# Patient Record
Sex: Male | Born: 1965 | Race: White | Hispanic: No | Marital: Married | Smoking: Never smoker
Health system: Southern US, Community
[De-identification: ages and names within clinical notes are randomized; demographics above are authoritative.]

## PROBLEM LIST (undated history)

## (undated) DIAGNOSIS — E079 Disorder of thyroid, unspecified: Secondary | ICD-10-CM

## (undated) DIAGNOSIS — N3010 Interstitial cystitis (chronic) without hematuria: Secondary | ICD-10-CM

## (undated) DIAGNOSIS — E7800 Pure hypercholesterolemia, unspecified: Secondary | ICD-10-CM

## (undated) DIAGNOSIS — F32A Depression, unspecified: Secondary | ICD-10-CM

## (undated) DIAGNOSIS — F329 Major depressive disorder, single episode, unspecified: Secondary | ICD-10-CM

## (undated) DIAGNOSIS — I10 Essential (primary) hypertension: Secondary | ICD-10-CM

## (undated) HISTORY — PX: HERNIA REPAIR: SHX51

## (undated) HISTORY — PX: ANKLE SURGERY: SHX546

## (undated) HISTORY — PX: VASECTOMY: SHX75

---

## 2000-03-16 ENCOUNTER — Emergency Department (HOSPITAL_COMMUNITY): Admission: EM | Admit: 2000-03-16 | Discharge: 2000-03-16 | Payer: Self-pay | Admitting: Emergency Medicine

## 2000-03-16 ENCOUNTER — Encounter: Payer: Self-pay | Admitting: Emergency Medicine

## 2000-06-30 ENCOUNTER — Encounter: Admission: RE | Admit: 2000-06-30 | Discharge: 2000-09-28 | Payer: Self-pay | Admitting: Anesthesiology

## 2000-11-22 ENCOUNTER — Encounter: Payer: Self-pay | Admitting: Emergency Medicine

## 2000-11-22 ENCOUNTER — Emergency Department (HOSPITAL_COMMUNITY): Admission: EM | Admit: 2000-11-22 | Discharge: 2000-11-22 | Payer: Self-pay | Admitting: Emergency Medicine

## 2001-01-22 ENCOUNTER — Emergency Department (HOSPITAL_COMMUNITY): Admission: EM | Admit: 2001-01-22 | Discharge: 2001-01-22 | Payer: Self-pay | Admitting: *Deleted

## 2003-03-10 ENCOUNTER — Encounter (INDEPENDENT_AMBULATORY_CARE_PROVIDER_SITE_OTHER): Payer: Self-pay

## 2003-03-10 ENCOUNTER — Ambulatory Visit (HOSPITAL_BASED_OUTPATIENT_CLINIC_OR_DEPARTMENT_OTHER): Admission: RE | Admit: 2003-03-10 | Discharge: 2003-03-10 | Payer: Self-pay | Admitting: Urology

## 2003-03-10 ENCOUNTER — Ambulatory Visit (HOSPITAL_COMMUNITY): Admission: RE | Admit: 2003-03-10 | Discharge: 2003-03-10 | Payer: Self-pay | Admitting: Urology

## 2003-09-17 ENCOUNTER — Emergency Department (HOSPITAL_COMMUNITY): Admission: EM | Admit: 2003-09-17 | Discharge: 2003-09-18 | Payer: Self-pay | Admitting: Emergency Medicine

## 2003-12-06 ENCOUNTER — Encounter: Admission: RE | Admit: 2003-12-06 | Discharge: 2003-12-06 | Payer: Self-pay | Admitting: Internal Medicine

## 2003-12-08 ENCOUNTER — Emergency Department (HOSPITAL_COMMUNITY): Admission: EM | Admit: 2003-12-08 | Discharge: 2003-12-08 | Payer: Self-pay | Admitting: Emergency Medicine

## 2004-11-06 ENCOUNTER — Ambulatory Visit (HOSPITAL_COMMUNITY): Admission: RE | Admit: 2004-11-06 | Discharge: 2004-11-06 | Payer: Self-pay | Admitting: Urology

## 2004-11-28 ENCOUNTER — Emergency Department (HOSPITAL_COMMUNITY): Admission: EM | Admit: 2004-11-28 | Discharge: 2004-11-29 | Payer: Self-pay | Admitting: Emergency Medicine

## 2004-12-21 ENCOUNTER — Ambulatory Visit (HOSPITAL_COMMUNITY): Admission: RE | Admit: 2004-12-21 | Discharge: 2004-12-21 | Payer: Self-pay | Admitting: Urology

## 2004-12-21 ENCOUNTER — Ambulatory Visit (HOSPITAL_BASED_OUTPATIENT_CLINIC_OR_DEPARTMENT_OTHER): Admission: RE | Admit: 2004-12-21 | Discharge: 2004-12-21 | Payer: Self-pay | Admitting: Urology

## 2004-12-21 ENCOUNTER — Encounter (INDEPENDENT_AMBULATORY_CARE_PROVIDER_SITE_OTHER): Payer: Self-pay | Admitting: Specialist

## 2005-02-05 ENCOUNTER — Ambulatory Visit: Payer: Self-pay | Admitting: Internal Medicine

## 2005-02-13 ENCOUNTER — Ambulatory Visit: Payer: Self-pay | Admitting: Internal Medicine

## 2005-02-13 ENCOUNTER — Encounter: Payer: Self-pay | Admitting: Internal Medicine

## 2005-02-13 ENCOUNTER — Ambulatory Visit (HOSPITAL_COMMUNITY): Admission: RE | Admit: 2005-02-13 | Discharge: 2005-02-13 | Payer: Self-pay | Admitting: Internal Medicine

## 2005-04-16 ENCOUNTER — Encounter (HOSPITAL_COMMUNITY): Admission: RE | Admit: 2005-04-16 | Discharge: 2005-05-16 | Payer: Self-pay | Admitting: Orthopedic Surgery

## 2005-05-21 ENCOUNTER — Encounter (HOSPITAL_COMMUNITY): Admission: RE | Admit: 2005-05-21 | Discharge: 2005-06-20 | Payer: Self-pay | Admitting: Orthopedic Surgery

## 2005-09-12 ENCOUNTER — Ambulatory Visit (HOSPITAL_BASED_OUTPATIENT_CLINIC_OR_DEPARTMENT_OTHER): Admission: RE | Admit: 2005-09-12 | Discharge: 2005-09-12 | Payer: Self-pay | Admitting: Orthopedic Surgery

## 2006-06-20 ENCOUNTER — Ambulatory Visit (HOSPITAL_COMMUNITY): Admission: RE | Admit: 2006-06-20 | Discharge: 2006-06-20 | Payer: Self-pay | Admitting: Family Medicine

## 2007-02-23 IMAGING — CT CT ABDOMEN W/ CM
1 of 3 series · 14 of 32 positions shown, 19 images · IV contrast (omnipaque)
Comparison: none

CLINICAL DATA: Abdominopelvic pain. 
ABDOMEN CT WITH CONTRAST:
TECHNIQUE: Multidetector CT imaging of the abdomen was performed following the standard protocol during bolus administration of intravenous contrast.
Contrast:  100 cc Omnipaque 300.
TECHNIQUE: Multidetector CT imaging of the pelvis was performed following the standard protocol during bolus administration of intravenous contrast.

[Series 2: abd_pel 5.0 b40f st · axial · 0.70mm/px · z∈[-539,-99]mm · 14 of 98 slices shown, 19 images]
[im 5/98  soft-tissue]
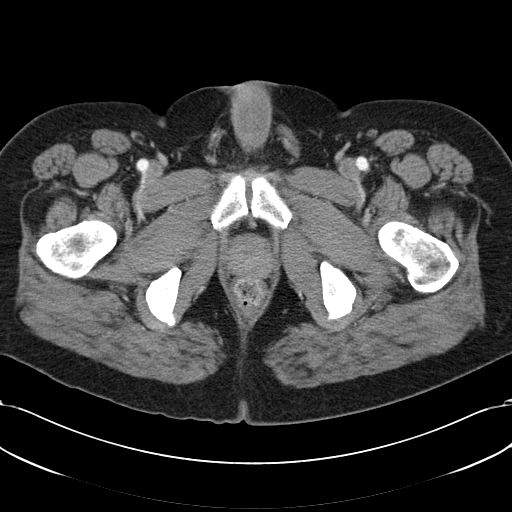
[im 5/98  bone]
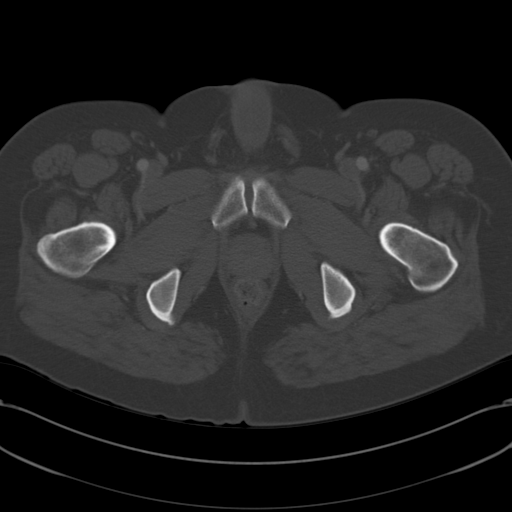
[im 14/98  soft-tissue]
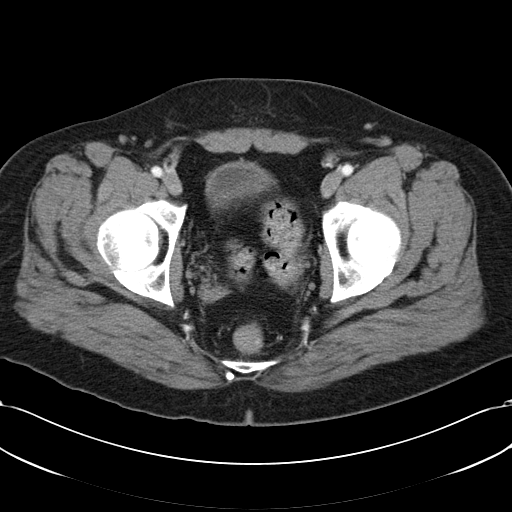
[im 19/98  soft-tissue]
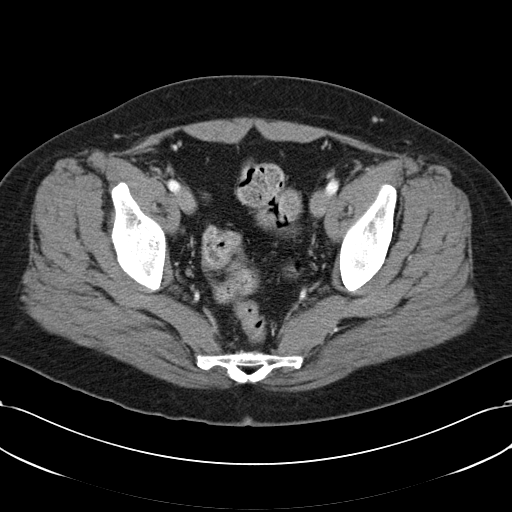
[im 28/98  soft-tissue]
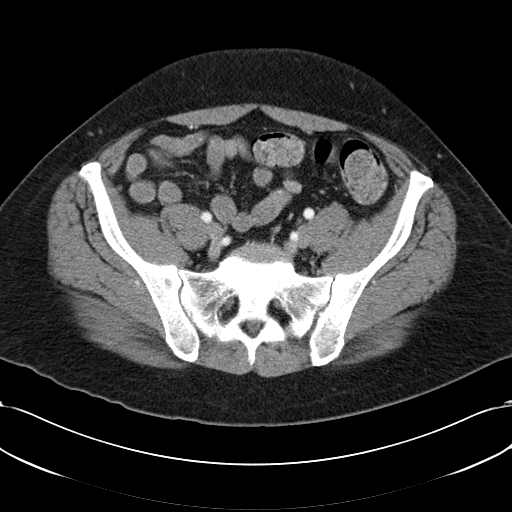
[im 33/98  soft-tissue]
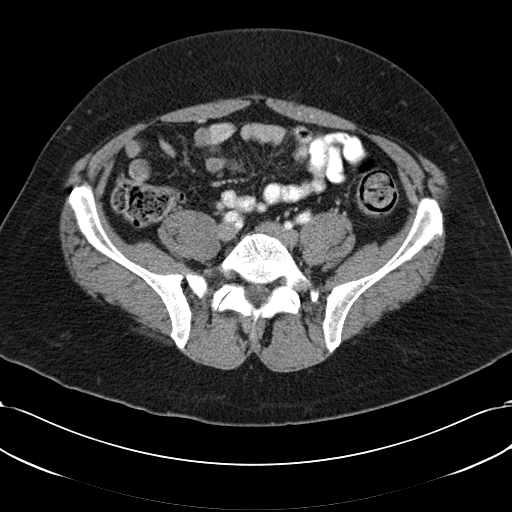
[im 42/98  soft-tissue]
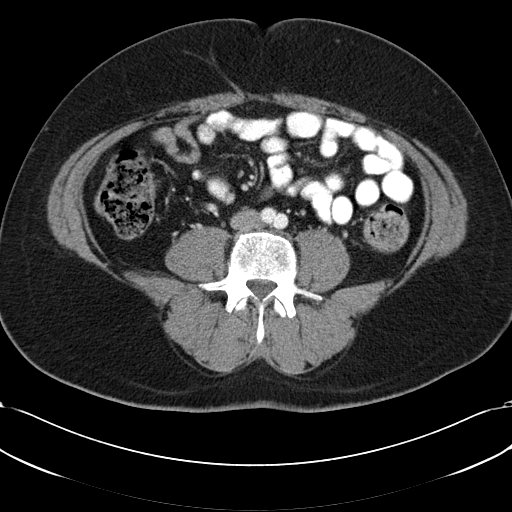
[im 51/98  soft-tissue]
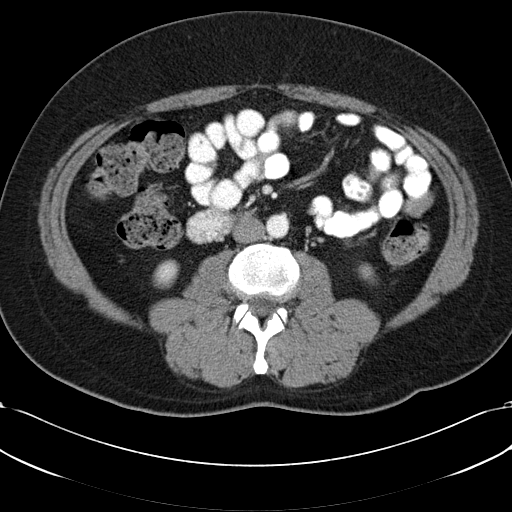
[im 56/98  soft-tissue]
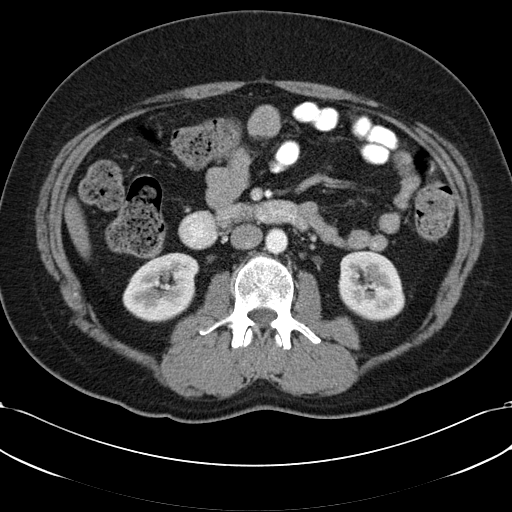
[im 65/98  soft-tissue]
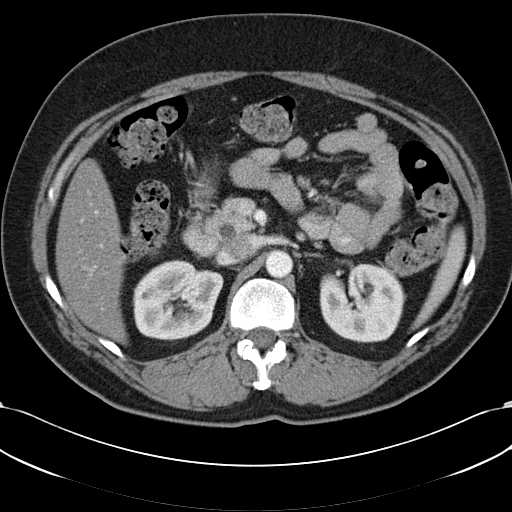
[im 65/98  bone]
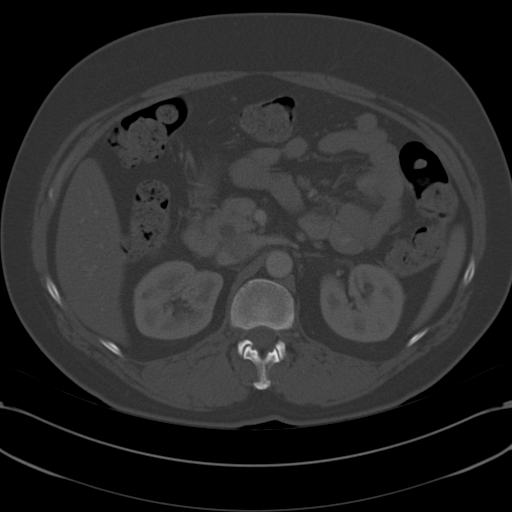
[im 70/98  soft-tissue]
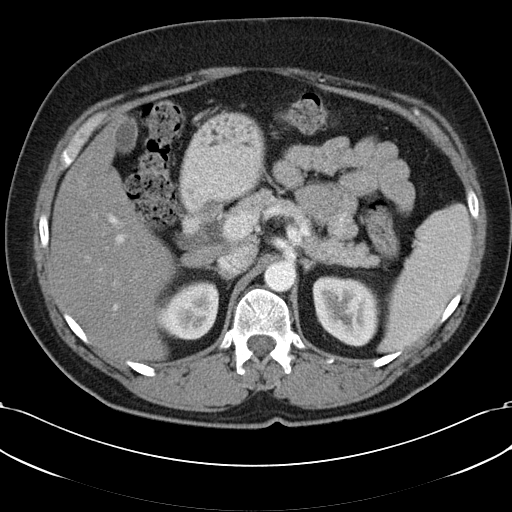
[im 79/98  soft-tissue]
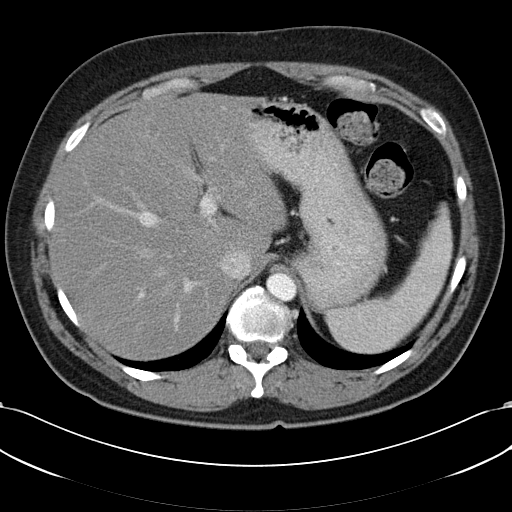
[im 79/98  lung]
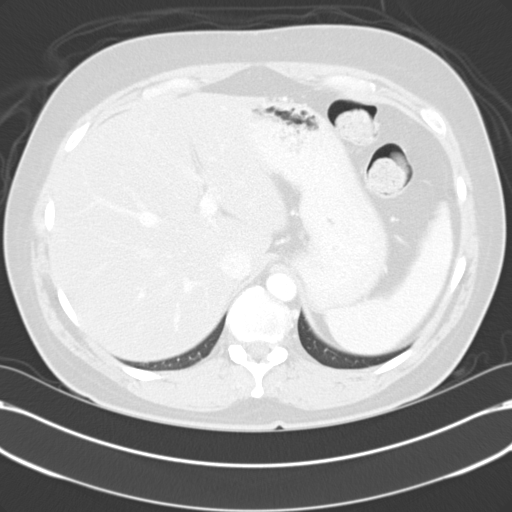
[im 84/98  soft-tissue]
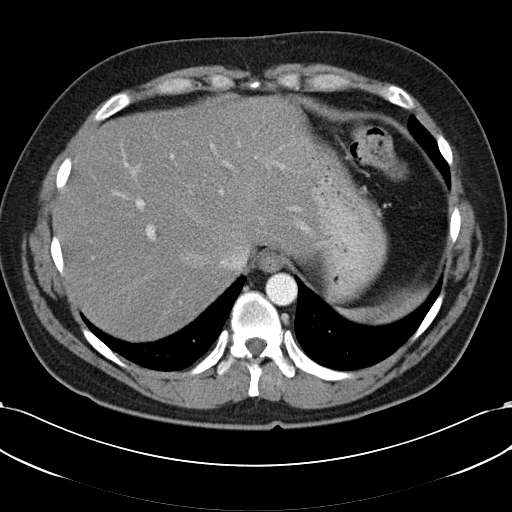
[im 84/98  lung]
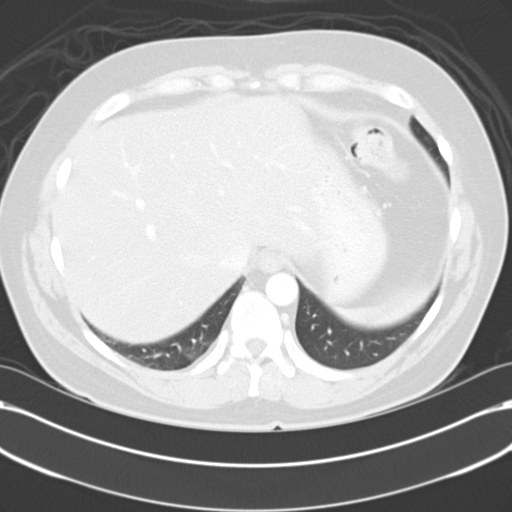
[im 88/98  lung]
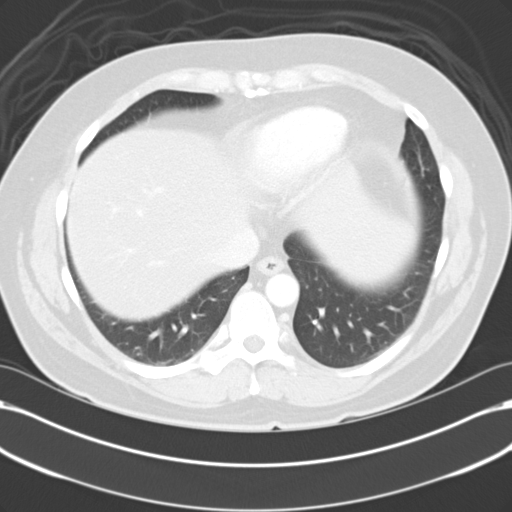
[im 93/98  soft-tissue]
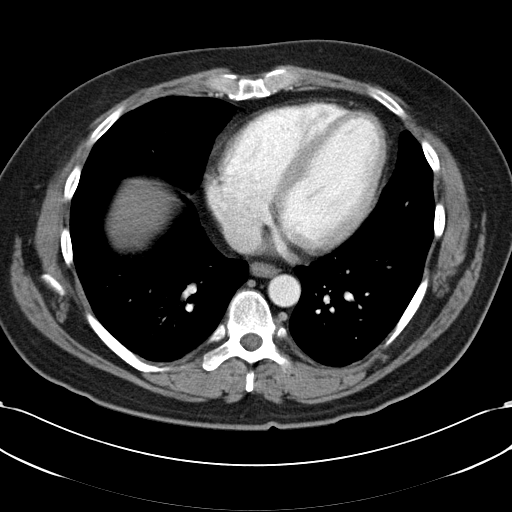
[im 93/98  lung]
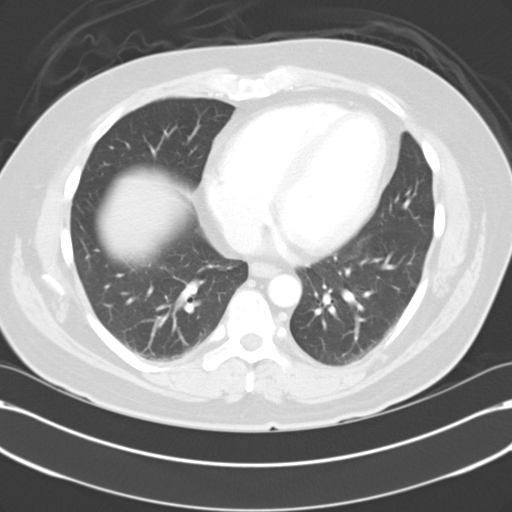

[14 of 32 positions shown; findings below may reference images not displayed]

FINDINGS: Diffuse fatty infiltration of the liver is noted.  There are several indeterminate low-density lesions in the liver that probably represent cysts.  Spleen, kidneys, adrenal glands, pancreas, and gallbladder are unremarkable.  No evidence of enlarged lymph nodes, free fluid, abdominal aortic aneurysm, or biliary dilatation.  The visualized bowel is within normal limits.
IMPRESSION: 1.  No acute abnormality. 
2.  Fatty infiltration of the liver. 
PELVIS CT WITH CONTRAST:
FINDINGS: The bladder and bowel are within normal limits.  No evidence of enlarged lymph nodes or free fluid.  No mass is identified.
IMPRESSION: No acute abnormality.

## 2007-03-02 ENCOUNTER — Encounter: Admission: RE | Admit: 2007-03-02 | Discharge: 2007-03-02 | Payer: Self-pay | Admitting: Orthopedic Surgery

## 2007-07-08 ENCOUNTER — Ambulatory Visit (HOSPITAL_COMMUNITY): Admission: RE | Admit: 2007-07-08 | Discharge: 2007-07-08 | Payer: Self-pay | Admitting: Orthopedic Surgery

## 2008-03-20 ENCOUNTER — Emergency Department (HOSPITAL_COMMUNITY): Admission: EM | Admit: 2008-03-20 | Discharge: 2008-03-20 | Payer: Self-pay | Admitting: Emergency Medicine

## 2009-08-25 ENCOUNTER — Ambulatory Visit (HOSPITAL_BASED_OUTPATIENT_CLINIC_OR_DEPARTMENT_OTHER): Admission: RE | Admit: 2009-08-25 | Discharge: 2009-08-25 | Payer: Self-pay | Admitting: Urology

## 2009-09-24 ENCOUNTER — Emergency Department (HOSPITAL_COMMUNITY): Admission: EM | Admit: 2009-09-24 | Discharge: 2009-09-24 | Payer: Self-pay | Admitting: Emergency Medicine

## 2010-06-25 LAB — POCT HEMOGLOBIN-HEMACUE: Hemoglobin: 13.8 g/dL (ref 13.0–17.0)

## 2010-08-21 NOTE — Op Note (Signed)
NAME:  Elijah Alvarez, Elijah Alvarez NO.:  [id number]   MEDICAL RECORD NO.:  [id number]          PATIENT TYPE:  AMB   LOCATION:  SDS                          FACILITY:  [facility]   PHYSICIAN:  Nadara Mustard, MD     DATE OF BIRTH:  [birth year]   DATE OF PROCEDURE:  07/08/2007  DATE OF DISCHARGE:                               OPERATIVE REPORT   PREOPERATIVE DIAGNOSIS:  Osteoarthritis, right foot talonavicular and  calcaneocuboid joints.   POSTOPERATIVE DIAGNOSIS:  Osteoarthritis, right foot talonavicular and  calcaneocuboid joints.   PROCEDURE:  Talonavicular and calcaneocuboid fusion, right foot.   SURGEON.:  Nadara Mustard, MD.   ANESTHESIA:  General plus popliteal block.   ESTIMATED BLOOD LOSS:  Minimal.   ANTIBIOTICS:  1 gram of Kefzol.   DRAINS:  None.   COMPLICATIONS:  None.   TOURNIQUET TIME:  Esmarch at the ankle for approximately 49 minutes.   DISPOSITION:  To the PACU in stable condition.   INDICATIONS FOR PROCEDURE:  The patient is a 45 year old gentleman who  is status post an ankle and subtalar fusion on the right.  The patient  has developed progressive degenerative arthritis of the talonavicular  and calcaneocuboid joints and presents at this time for fusion.  The  risks and benefits were discussed including infection, neurovascular  injury, persistent pain, need for additional surgery.  The patient  states he understands and wished to proceed at this time.   DESCRIPTION OF PROCEDURE:  The patient was brought to OR room 1 after  undergoing a popliteal block.  He then underwent a general anesthetic.  After an adequate level of anesthesia was obtained, the patient's right  lower extremity was prepped using DuraPrep and draped into a sterile  field.  The previous oblique incision over the sinus tarsi was extended  distally.  This was carried down to the calcaneocuboid joint.  The  cartilage was removed from the joint.  The wound was irrigated with  normal  saline.  There was good bleeding bone on both sides of the joint.  Attention was then focused on the talonavicular joint.  A dorsal  incision was made over the anterior tibial tendon.  Dissection was  carried down medial to the anterior tibial tendon and the talonavicular  joint was identified.  The articular cartilage was removed from the  joint.  The foot was then stabilized.  The patient was in slight  supination previously.  He was stabilized in a slight amount of valgus  to decrease stress over the lateral column.  Once this was stabilized  and held in place, the ankle dorsiflexed, and then two 3.5 cortical  screws were placed through the calcaneocuboid joint.  C-arm fluoroscopy  verified reduction in both the AP and lateral planes.  Two additional  screws were placed in the talonavicular joint and C-arm fluoroscopy also  verified reduction in both the AP and lateral planes.  The Esmarch was  released after 49 minutes.  Hemostasis was obtained.  The subcu was  closed using 2-0 Vicryl.  The skin was closed using 2-0 nylon.  The  wounds were covered Adaptic, orthopedic sponges, sterile Webril, Kerlix  and a Coban dressing.  The patient was then extubated and taken to the  PACU in stable condition.  Plan for discharge to home.  Prescription for  Tylox for pain.  Follow up in office in 2 weeks.      Nadara Mustard, MD  Electronically Signed     MVD/MEDQ  D:  07/08/2007  T:  07/08/2007  Job:  [id number]

## 2010-08-24 NOTE — Op Note (Signed)
NAME:  Elijah Alvarez, Elijah Alvarez NO.:  [id number]   MEDICAL RECORD NO.:  [id number]           PATIENT TYPE:   LOCATION:                                 FACILITY:   PHYSICIAN:  Nadara Mustard, MD          DATE OF BIRTH:   DATE OF PROCEDURE:  09/12/2005  DATE OF DISCHARGE:                                 OPERATIVE REPORT   PREOP DIAGNOSIS:  Osteoarthritis right ankle.   POSTOP DIAGNOSIS:  Osteoarthritis right ankle.   PROCEDURE:  Right ankle arthroscopy with percutaneous fusion with two 7.3  cannulated screws.   SURGEON:  Nadara Mustard, MD   ANESTHESIA:  General plus popliteal block.   ESTIMATED BLOOD LOSS:  Minimal.   ANTIBIOTICS:  1 gram of Kefzol.   DRAINS:  None.   COMPLICATIONS:  None.   TOURNIQUET TIME:  None.   DISPOSITION:  To PACU in stable condition.   INDICATIONS FOR PROCEDURE:  The patient is a 45 year old gentleman who is  status post a Taylor fracture; who has undergone approximately 4 surgical  procedures on his right ankle, secondary to the previous traumatic fracture.  The patient has degenerative arthritic changes of the tibial talar joint.  He has failed conservative care; has pain with activities of daily living;  and wishes to proceed with fusion of the ankle.  Risks and benefits were  discussed including infection, neurovascular injury, persistent pain, need  for additional surgery.  The patient states he understands and wishes to  proceed at this time.   DESCRIPTION OF PROCEDURE:  The patient was brought to OR room #5 after  undergoing a popliteal block.  After adequate level of anesthesia obtained,  the patient's right lower extremity was prepped using DuraPrep, draped into  a sterile field; and his foot was placed in the ankle distraction traction.  Previous arthroscopic portals were used; and the scope was inserted through  the anterior medial portal, and the anterior lateral portal was established  for a working portal.   Visualization showed complete traumatic arthritis  involving the entire tibial talar joint.   The vapor Mitek was used for debridement of the synovitis; and the shaver  was also used for debridement of synovitis.  The acromionizer was then used  to debride the articular cartilage back to bleeding viable subchondral bone,  both on the to the tibia and talus.  This was a beveled flap to allow for  good cancellus bone contact.  The ankle was placed in 90 degrees of  dorsiflexion.  There was good subchondral bone contact across the joint.  The instruments were removed and 2 percutaneous 7.3 screws were placed  proximal medial to distal lateral from the tibial to the talus.  These were  measured 55 and 75 in length.  These were over drilled and the counter sink  tap was used.  The screws were placed under C-arm fluoroscopy to verify  placement.  Radiographs showed an ankle with 90-degrees of dorsiflexion with  good contact across the fusion site.   The instruments were removed.  Guidewires were removed.  The portals were  closed using 3-0 nylon.  The wounds were covered with Adaptic orthopedic  sponges, ABD dressing, Webril and a Coban dressing.  The patient was  extubated, taken to PACU in stable condition.  Plan for discharge to home.  Prescription for Tylox.  Instructions for ice, elevation, nonweightbearing  on the right follow-up in the office in 1 week.      Nadara Mustard, MD  Electronically Signed     MVD/MEDQ  D:  09/12/2005  T:  09/13/2005  Job:  [id number]

## 2010-08-24 NOTE — Op Note (Signed)
NAME:  Elijah Alvarez, Elijah Alvarez NO.:  [id number]   MEDICAL RECORD NO.:  [id number]          PATIENT TYPE:  AMB   LOCATION:  [facility]                         FACILITY:  [facility]   PHYSICIAN:  Sigmund I. Patsi Sears, M.D.DATE OF BIRTH:  December 16, 1965   DATE OF PROCEDURE:  12/21/2004  DATE OF DISCHARGE:                                 OPERATIVE REPORT   PREOPERATIVE DIAGNOSIS:  Interstitial cystitis.   POSTOPERATIVE DIAGNOSIS.:  Interstitial cystitis.   OPERATIONS:  Cystourethroscopy, hydrodistention of bladder (650 cc), biopsy  of the bladder, cauterization of the biopsy sites, installation of Pyridium  and Marcaine solution in the  bladder.   SURGEON:  Sigmund I. Patsi Sears, M.D.   ANESTHESIA:  General LMA.   PREPARATION:  Appropriate preanesthesia, the patient is brought to the  operating room, placed on the operating room in dorsal supine position where  general LMA anesthesia was induced. He was then placed in dorsal lithotomy  position. B&O suppository was given. Cystoscopy was accomplished, and the  bladder was hydrodistended to 650 cc. Following this, cold cup bladder  biopsies were taken. Multiple areas of bladder hemorrhage were identified  consistent with interstitial cystitis. Following this, the bladder was  drained of fluid, and Marcaine and Pyridium solution was inserted in the  bladder. The patient received IV Toradol, awakened and taken to the recovery  room in good condition.      Sigmund I. Patsi Sears, M.D.  Electronically Signed     SIT/MEDQ  D:  12/21/2004  T:  12/22/2004  Job:  [id number]

## 2010-08-24 NOTE — Op Note (Signed)
NAME:  TORREZ, RENFROE                ACCOUNT NO.:  [id number]   MEDICAL RECORD NO.:  [id number]          PATIENT TYPE:  AMB   LOCATION:  DAY                           FACILITY:  [facility]   PHYSICIAN:  R. Roetta Sessions, M.D. DATE OF BIRTH:  April 05, 1966   DATE OF PROCEDURE:  02/13/2005  DATE OF DISCHARGE:                                 OPERATIVE REPORT   PROCEDURE:  Colonoscopy, ileoscopy, biopsy.   INDICATIONS FOR PROCEDURE:  The patient is a 45 year old Caucasian male with  chronic intermittent constipation on methadone therapy with intermittent  paper hematochezia. Colonoscopy is now being done. This approach has been  discussed with the patient at length. Potential risks, benefits, and  alternatives have been reviewed and questions answered. He is agreeable.  Please see documentation in the medical record.   PROCEDURE NOTE:  O2 saturation, blood pressure, pulse, and respirations were  monitored throughout the entire procedure. Conscious sedation with Versed 5  mg IV and Demerol 125 mg IV in divided doses.   INSTRUMENT:  Olympus video chip system.   FINDINGS:  Digital rectal exam revealed no abnormalities.   ENDOSCOPIC FINDINGS:  Prep was adequate.   Rectum:  Examination of the rectal mucosa including retroflexed view of the  anal verge and ___________ view of the anal canal revealed only anal canal  internal hemorrhoids. The rectal mucosa otherwise appeared entirely normal.   Colon:  Colonic mucosa was surveyed from the rectosigmoid junction through  the left, transverse, and right colon to the area of the appendiceal  orifice, ileocecal valve, and cecum. These structures were well seen and  photographed for the record. Terminal ileum was intubated to 10 cm. From  this level, the scope was slowly withdrawn, and all previously mentioned  mucosal surfaces were again seen. The terminal ileal mucosa and the entire  colonic mucosa appeared normal except for a 3-mm diminutive polyp at  30 cm  which was cold biopsied/removed. The patient tolerated the procedure well  and was reactive to endoscopy.   IMPRESSION:  1.  Anal canal and internal hemorrhoids. Otherwise normal rectum.  2.  Diminutive polyp at 30 cm, cold biopsied/removed. The remainder of the      colonic mucosa and terminal ileum appeared normal.   I suspect the patient is bleeding from his anorectum secondary to  hemorrhoids. An occult fissure is not excluded at this time.   RECOMMENDATIONS:  1.  Hemorrhoid literature provided to Mr. Fortenberry. Continue MiraLax. Would use      liberally on a nightly basis to produce relatively good bowel daily to      every other day. Daily Metamucil or Citrucel fiber supplement. Ten-      day course of Anusol HC suppositories 1 per rectum at bedtime.  2.  Follow up on pathology.  3.  Further recommendations to follow.      Jonathon Bellows, M.D.  Electronically Signed     RMR/MEDQ  D:  02/13/2005  T:  02/13/2005  Job:  [id number]   cc:   Corrie Mckusick, M.D.  Fax: [phone]

## 2010-08-24 NOTE — Consult Note (Signed)
NAME:  Elijah Alvarez, Elijah Alvarez                ACCOUNT NO.:  [id number]   MEDICAL RECORD NO.:  [id number]          PATIENT TYPE:  AMB   LOCATION:                                FACILITY:  [facility]   PHYSICIAN:  R. Roetta Sessions, M.D. DATE OF BIRTH:  01/17/1966   DATE OF CONSULTATION:  02/05/2005  DATE OF DISCHARGE:                                   CONSULTATION   CHIEF COMPLAINT:  Abdominal pain, nausea, constipation, and intermittent  hematochezia.   HISTORY OF PRESENT ILLNESS:  Mr. Elijah Alvarez is a 45 year old Caucasian male who  is referred from Dr. Phillips Odor. He notes about three or four months ago he was  diagnosed with prostatitis. He was started on Cipro. He was also treated  with doxycycline. He began to have bilateral low abdominal cramping pain  which radiated to his mid abdomen. He describes the pain as a soreness.  Pain is rated 8/10 on the pain scale. He has been to the ER for this pain.  Generally, the patient is much better today. Since he did continue the  antibiotics and was started on Elmiron, he feels much better. He did have  some nausea and vomiting, but this has mostly subsided as well. He still has  occasional waves of transient nausea. He generally has a bowel movement  every three to four days which is formed. He does note toilet tissue  hematochezia in a small to moderate amount on a few occasions recently. He  also complains of proctalgia and rectal soreness. He denies any melena.   PAST MEDICAL HISTORY:  1.  Sarcoidosis.  2.  Depression.  3.  Prostatitis.  4.  Interstitial cystitis.  5.  Chronic back pain for which he is followed by Dr. Omelia Blackwater.  6.  GERD.  7.  Hernia/hydrocele repair.  8.  Ankle fracture, right ankle surgery due to fracture.  9.  Right carpal tunnel repair.   CURRENT MEDICATIONS:  1.  Elmiron 200 mg b.i.d.  2.  Ritalin 40 mg daily.  3.  Methadone 10 mg t.i.d.  4.  Serzone 450 mg daily.  5.  Neurontin 300 mg b.i.d.  6.  Prevacid 30 mg q.a.m.  7.   Fleet's stool softener p.r.n.   ALLERGIES:  No known drug allergies.   FAMILY HISTORY:  No known family history of colorectal carcinoma of the  liver or chronic GI problems other than a mother with IBS. His father tells  me he has one healthy sister.   SOCIAL HISTORY:  Mr. Elijah Alvarez has been married for 13 years. He has healthy 25-  year-old. He is self-employed and does home repairs. 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 Currently,  he denies either.   REVIEW OF SYSTEMS:  CONSTITUTIONAL:  Denies any anorexia. Denies any early  satiety. His weight is stable. Denies any fever or chills. CARDIOVASCULAR:  Denies any chest pain or palpitations. PULMONARY:  Denies any shortness of  breath, dyspnea, cough, or hemoptysis. GASTROINTESTINAL:  He has history of  heartburn and indigestion as well  as water brash. He has been treated with  Prevacid 30 mg daily for about the last 8 months which has resulted in good  relief of his symptoms. He denies any dysphagia or odynophagia.   PHYSICAL EXAMINATION:  VITAL SIGNS:  Weight 206 pounds, height 71 inches,  temperature 98.3, blood pressure 106/70, pulse 64.  GENERAL:  Mr. Elijah Alvarez is a 45 year old obese Caucasian male who is alert,  oriented, pleasant and cooperative in no acute distress.  HEENT:  Sclerae are clear and nonicteric. Conjunctivae is pink. Oropharynx  pink and moist without any lesions.  NECK:  Supple without any mass or thyromegaly.  CHEST:  Heart regular rate and rhythm. Normal S1 and S2. Lungs are clear to  auscultation bilaterally.  ABDOMEN:  Positive bowel sounds x4. No bruits auscultated. Soft,  nondistended, without palpable mass or hepatosplenomegaly. He does have some  mild tenderness to the left upper quadrant on deep palpation. No rebound  tenderness or guarding.  RECTAL:  No external lesions visualized. Good sphincter tone. No internal  masses palpated. Small amount of light brown  stool was obtained from the  vault which was hemoccult negative.  EXTREMITIES:  Without edema bilaterally.   LABORATORY DATA:  CT scan from November 29, 2004 shows fatty infiltration of  the liver and otherwise normal exam. Normal LFTs on December 05, 2004.   IMPRESSION:  Mr. Elijah Alvarez is a 45 year old Caucasian male who presents today  with about a four-month history of abdominal pain, and nausea which began  after treatment with antibiotics for his prostatitis. Now, his symptoms have  pretty resolved. He continues to have transient nausea without emesis. He  does have chronic constipation, and he generally has a bowel movement every  three to four days. He also notes toilet tissue hematochezia in small to  moderate amount. There was nothing on rectal exam to account for this.  Therefore, I feel we should proceed with colonoscopy at this time. He was  also noted to have fatty liver on ultrasound and has a set of normal LFTs  from Dr. Lamar Blinks office. Gastroesophageal reflux disease symptoms well  controlled on daily PPI.   PLAN:  1.  We will schedule colonoscopy with Dr. Jena Gauss in the near future. I have      discussed the procedure including risks and benefits which include but      are not limited to bleeding, infection, perforation, drug reaction. He      agrees with this plan, and consent will be obtained.  2.  Prescription given for Maalox 17 g daily ________________ two refills.   We would like to thank Dr. Phillips Odor for allowing [place] to participate in the  care of Mr. Elijah Alvarez.      Nicholas Lose, N.P.      Jonathon Bellows, M.D.  Electronically Signed    KC/MEDQ  D:  02/06/2005  T:  02/06/2005  Job:  [id number]   cc:   Corrie Mckusick, M.D.  Fax: [phone]

## 2010-08-24 NOTE — Op Note (Signed)
NAME:  Elijah Alvarez, Elijah Alvarez                          ACCOUNT NO.:  [id number]   MEDICAL RECORD NO.:  [id number]                   PATIENT TYPE:  AMB   LOCATION:  [facility]                                 [facility]ACILITY[facility]   PHYSICIAN:  Sigmund I. Patsi Sears, M.D.         DATE OF BIRTH:  [birth year]   DATE OF PROCEDURE:  03/10/2003  DATE OF DISCHARGE:                                 OPERATIVE REPORT   PREOPERATIVE DIAGNOSIS:  Elective sterilization.   POSTOPERATIVE DIAGNOSIS:  Elective sterilization.   OPERATION:  Vasectomy.   SURGEON:  Sigmund I. Patsi Sears, M.D.   ANESTHESIA:  LMA.   PREPARATION:  After appropriate preanesthesia, the patient was brought to  the operating room, placed on the operating table in a dorsal supine  position, where general LMA anesthesia was introduced.  He remained in this  position, where the pubis was prepped with Betadine solution and draped in  the usual fashion.  Because the patient shaved himself greater than 12 hours  before surgery, it was elected to give him IV Ancef and postoperative  doxycycline to avoid any skin infection.   REVIEW OF HISTORY:  This 45 year old married male, who with his wife desires  vasectomy, also has a history of scrotal surgery in the past with scarring  on the right hemiscrotum.  He is now for vasectomy.   DESCRIPTION OF PROCEDURE:  The right and left vas were isolated bilaterally,  and incisions were made bilaterally.  The vas was then insolated  bilaterally, injected with Marcaine solution 0.25% plain, and dissected.  A  portion is amputated, and ends are cauterized.  Each end is then oversewn  with 3-0 Vicryl suture.  Following this, the wounds are closed in two layers  with 3-0 Vicryl suture and collodion is placed over the wounds.  The patient  was then awakened and taken to the recovery room in good condition.                                               Sigmund I. Patsi Sears, M.D.    SIT/MEDQ  D:  03/10/2003  T:   03/10/2003  Job:  [id number]
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
## 2010-12-31 LAB — BASIC METABOLIC PANEL
BUN: 11
Calcium: 9.7
Chloride: 101
Creatinine, Ser: 0.9
Glucose, Bld: 105 — ABNORMAL HIGH
Potassium: 5.1
Sodium: 139

## 2010-12-31 LAB — CBC
HCT: 37.8 — ABNORMAL LOW
Hemoglobin: 12.9 — ABNORMAL LOW
MCHC: 34.2
Platelets: 190
RBC: 4.3
WBC: 3.8 — ABNORMAL LOW

## 2010-12-31 LAB — HEPATIC FUNCTION PANEL: Albumin: 3.9

## 2011-09-13 ENCOUNTER — Other Ambulatory Visit (HOSPITAL_COMMUNITY): Payer: Self-pay | Admitting: Internal Medicine

## 2011-09-13 DIAGNOSIS — R1313 Dysphagia, pharyngeal phase: Secondary | ICD-10-CM

## 2011-09-17 ENCOUNTER — Other Ambulatory Visit (HOSPITAL_COMMUNITY): Payer: Self-pay

## 2011-09-20 ENCOUNTER — Other Ambulatory Visit (HOSPITAL_COMMUNITY): Payer: Self-pay

## 2012-02-11 ENCOUNTER — Ambulatory Visit (INDEPENDENT_AMBULATORY_CARE_PROVIDER_SITE_OTHER): Payer: Federal, State, Local not specified - PPO | Admitting: Orthopedic Surgery

## 2012-02-11 ENCOUNTER — Other Ambulatory Visit: Payer: Self-pay | Admitting: Orthopedic Surgery

## 2012-02-11 ENCOUNTER — Encounter: Payer: Self-pay | Admitting: Orthopedic Surgery

## 2012-02-11 ENCOUNTER — Ambulatory Visit (HOSPITAL_COMMUNITY)
Admission: RE | Admit: 2012-02-11 | Discharge: 2012-02-11 | Disposition: A | Discharge status: Home / Self Care | Payer: Federal, State, Local not specified - PPO | Source: Ambulatory Visit | Attending: Orthopedic Surgery | Admitting: Orthopedic Surgery

## 2012-02-11 VITALS — Ht 71.0 in | Wt 228.0 lb

## 2012-02-11 DIAGNOSIS — M069 Rheumatoid arthritis, unspecified: Secondary | ICD-10-CM

## 2012-02-11 DIAGNOSIS — M7989 Other specified soft tissue disorders: Secondary | ICD-10-CM | POA: Insufficient documentation

## 2012-02-11 DIAGNOSIS — G5601 Carpal tunnel syndrome, right upper limb: Secondary | ICD-10-CM | POA: Insufficient documentation

## 2012-02-11 DIAGNOSIS — G5600 Carpal tunnel syndrome, unspecified upper limb: Secondary | ICD-10-CM

## 2012-02-11 NOTE — Patient Instructions (Addendum)
Go to lab across from hospital for lab work   NCS with Dr Gerilyn Pilgrim for NCS carpal tunnel syndrome   Go to [facility] for bilateral hand x- rays

## 2012-02-12 ENCOUNTER — Encounter: Payer: Self-pay | Admitting: Orthopedic Surgery

## 2012-02-12 NOTE — Progress Notes (Signed)
Patient ID: Elijah Alvarez, male   DOB: 24-Mar-1966, 46 y.o.   MRN: [id number] Chief Complaint  Patient presents with  . Hand Problem    Referring physician Dr. Phillips Odor, swelling of the hands and feet    This is a 46 year old male who complains of bilateral swelling of his hands and feet for the last 2 months. Symptoms came on gradually he status post a right carpal tunnel release. He says these symptoms feel different. He complains of dull burning 3/10 pain which is intermittent tends to come and go and will for 3 or 4 days and then go away with no provoking mechanism. Swelling noted numbness and tingling noted as stated.  He does say that the hands feel tight . Again he thinks that these symptoms are different from the carpal tunnel.  Other review of systems he has some depression otherwise symptoms in the review of systems are negative other than described  Medical history of depression, thyroid disease, high cholesterol, cystitis. He's had 11 surgeries on the right ankle. He has 2 hernia repairs. Right carpal tunnel release.   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 He completed 12 years of school he does not smoke drink or use street drugs.  His medications include a medication for cholesterol, thyroid, he takes methadone, Ritalin, Cymbalta, and ELMIRON Synthroid 100 mcg daily, Cymbalta 20 mg daily, Lipitor 10 mg daily, Viagra 100 mg as needed, methadone 5 mg every 4 hours  Ht 5\' 11"  (1.803 m)  Wt 228 lb (103.42 kg)  BMI 31.80 kg/m2 Further examination reveals that this is a well-developed well-nourished male he appears to have good mood and affect he walks normally he is oriented x3 his appearance is normal.  Both hands are swollen and tight. He has decreased range of motion. No instability. Decreased grip strength. Incision over the right carpal tunnel. Skin over the left wrist normal.  Distal pulses and temperature  normal. Lymph nodes normal in the epitrochlear region. He has normal sensation to soft touch. No pathologic reflexes with a negative Hoffman test. Balance normal.  Recommend further diagnostic studies to include I nerve conduction study. A sedimentation rate and a rheumatoid factor  Call patient with results after nerve conduction study. Not sure of diagnosis  Our working diagnosis will be recurrent carpal tunnel syndrome and rheumatoid arthritis

## 2012-02-13 ENCOUNTER — Telehealth: Payer: Self-pay | Admitting: Orthopedic Surgery

## 2012-02-13 NOTE — Telephone Encounter (Signed)
Elijah Alvarez called to ask if you have his lab results yet. His # is [phone]

## 2012-02-14 ENCOUNTER — Telehealth: Payer: Self-pay | Admitting: Radiology

## 2012-02-14 NOTE — Telephone Encounter (Signed)
I faxed a referral to Dr. Gerilyn Pilgrim for patient to have NCS.

## 2014-01-03 ENCOUNTER — Other Ambulatory Visit (HOSPITAL_COMMUNITY): Payer: Self-pay | Admitting: Family Medicine

## 2014-01-03 DIAGNOSIS — E049 Nontoxic goiter, unspecified: Secondary | ICD-10-CM

## 2014-01-03 DIAGNOSIS — E039 Hypothyroidism, unspecified: Secondary | ICD-10-CM

## 2014-01-04 ENCOUNTER — Ambulatory Visit (HOSPITAL_COMMUNITY)
Admission: RE | Admit: 2014-01-04 | Discharge: 2014-01-04 | Disposition: A | Discharge status: Home / Self Care | Payer: Federal, State, Local not specified - PPO | Source: Ambulatory Visit | Attending: Family Medicine | Admitting: Family Medicine

## 2014-01-04 DIAGNOSIS — E039 Hypothyroidism, unspecified: Secondary | ICD-10-CM | POA: Insufficient documentation

## 2014-01-04 DIAGNOSIS — E049 Nontoxic goiter, unspecified: Secondary | ICD-10-CM | POA: Insufficient documentation

## 2015-01-18 ENCOUNTER — Telehealth: Payer: Self-pay | Admitting: Internal Medicine

## 2015-01-18 NOTE — Telephone Encounter (Signed)
NOV RECALL FOR TCS °

## 2015-01-18 NOTE — Telephone Encounter (Signed)
Letter mailed to pt.  

## 2015-02-28 ENCOUNTER — Emergency Department (HOSPITAL_COMMUNITY)
Admission: EM | Admit: 2015-02-28 | Discharge: 2015-02-28 | Disposition: A | Discharge status: Home / Self Care | Payer: Federal, State, Local not specified - PPO | Attending: Emergency Medicine | Admitting: Emergency Medicine

## 2015-02-28 ENCOUNTER — Encounter (HOSPITAL_COMMUNITY): Payer: Self-pay

## 2015-02-28 DIAGNOSIS — Z79899 Other long term (current) drug therapy: Secondary | ICD-10-CM | POA: Insufficient documentation

## 2015-02-28 DIAGNOSIS — F329 Major depressive disorder, single episode, unspecified: Secondary | ICD-10-CM | POA: Insufficient documentation

## 2015-02-28 DIAGNOSIS — G4700 Insomnia, unspecified: Secondary | ICD-10-CM | POA: Insufficient documentation

## 2015-02-28 DIAGNOSIS — F1120 Opioid dependence, uncomplicated: Secondary | ICD-10-CM | POA: Diagnosis present

## 2015-02-28 DIAGNOSIS — F419 Anxiety disorder, unspecified: Secondary | ICD-10-CM | POA: Diagnosis not present

## 2015-02-28 DIAGNOSIS — R000 Tachycardia, unspecified: Secondary | ICD-10-CM | POA: Diagnosis not present

## 2015-02-28 DIAGNOSIS — F1123 Opioid dependence with withdrawal: Secondary | ICD-10-CM

## 2015-02-28 DIAGNOSIS — E7800 Pure hypercholesterolemia, unspecified: Secondary | ICD-10-CM | POA: Diagnosis not present

## 2015-02-28 DIAGNOSIS — E785 Hyperlipidemia, unspecified: Secondary | ICD-10-CM | POA: Diagnosis not present

## 2015-02-28 DIAGNOSIS — I10 Essential (primary) hypertension: Secondary | ICD-10-CM | POA: Diagnosis not present

## 2015-02-28 DIAGNOSIS — Z87448 Personal history of other diseases of urinary system: Secondary | ICD-10-CM | POA: Diagnosis not present

## 2015-02-28 DIAGNOSIS — F1193 Opioid use, unspecified with withdrawal: Secondary | ICD-10-CM

## 2015-02-28 HISTORY — DX: Major depressive disorder, single episode, unspecified: F32.9

## 2015-02-28 HISTORY — DX: Pure hypercholesterolemia, unspecified: E78.00

## 2015-02-28 HISTORY — DX: Disorder of thyroid, unspecified: E07.9

## 2015-02-28 HISTORY — DX: Depression, unspecified: F32.A

## 2015-02-28 HISTORY — DX: Interstitial cystitis (chronic) without hematuria: N30.10

## 2015-02-28 NOTE — ED Provider Notes (Signed)
CSN: [id number]     Arrival date & time 02/28/15  2103 History   First MD Initiated Contact with Patient 02/28/15 2159     Chief Complaint  Patient presents with  . Addiction Problem  . Hypertension     (Consider location/radiation/quality/duration/timing/severity/associated sxs/prior Treatment) HPI Comments: 49 year old male with past medical history including depression, hyperlipidemia, methadone dependence who presents with medication withdrawal. Patient reports a 15 year history of methadone use. He is going to be scheduled for ankle joint replacement in the future and wants to be off methadone before the surgery. He has attempted to wean himself from methadone, going from 60 mg daily to 30 mg 2-3 weeks ago, then to  ~1 week ago. He reports several days of difficulty sleeping despite taking melatonin and Benadryl, diaphoresis, hypertension, and anxiety. He states that he has decreased energy for his depression seems worse but he denies any suicidal ideation. His wife has been assisting him with his methadone administration. He has an appointment in December with a psychiatrist who prescribes his methadone but has not yet contacted them regarding weaning off of the medication. No fevers or recent illness.  Patient is a 49 y.o. male presenting with hypertension. The history is provided by the patient.  Hypertension    Past Medical History  Diagnosis Date  . Thyroid disease   . Hypercholesteremia   . Interstitial cystitis   . Depression    Past Surgical History  Procedure Laterality Date  . Ankle surgery      multiple right ankle surgeries  . Hernia repair    . Vasectomy     No family history on file. Social History  Substance Use Topics  . Smoking status: Never Smoker   . Smokeless tobacco: None  . Alcohol Use: No    Review of Systems 10 Systems reviewed and are negative for acute change except as noted in the HPI.    Allergies  Review of patient's allergies  indicates no known allergies.  Home Medications   Prior to Admission medications   Medication Sig Start Date End Date Taking? Authorizing Provider  atorvastatin (LIPITOR) 10 MG tablet Take 1 tablet by mouth every morning.  02/27/15  Yes Historical Provider, MD  Ciclopirox 1 % shampoo Apply 1 application topically See admin instructions. Every third day 12/01/14  Yes Historical Provider, MD  diphenhydrAMINE (BENADRYL) 25 mg capsule Take 150-200 mg by mouth at bedtime as needed for sleep.   Yes Historical Provider, MD  DULoxetine (CYMBALTA) 60 MG capsule Take 1 capsule by mouth daily. 02/27/15  Yes Historical Provider, MD  ELMIRON 100 MG capsule Take 1 capsule by mouth daily. 02/27/15  Yes Historical Provider, MD  levothyroxine (SYNTHROID, LEVOTHROID) 100 MCG tablet Take 1 tablet by mouth daily. 02/27/15  Yes Historical Provider, MD  levothyroxine (SYNTHROID, LEVOTHROID) 25 MCG tablet Take 1 tablet by mouth daily. 02/27/15  Yes Historical Provider, MD  methadone (DOLOPHINE) 10 MG tablet Take 30 mg by mouth 2 (two) times daily.   Yes Historical Provider, MD   BP 157/117 mmHg  Pulse 117  Temp(Src) 98.1 F (36.7 C) (Oral)  Resp 20  Wt 215 lb (97.523 kg)  SpO2 100% Physical Exam  Constitutional: He is oriented to person, place, and time. He appears well-developed and well-nourished. No distress.  HENT:  Head: Normocephalic and atraumatic.  Moist mucous membranes  Eyes: Pupils are equal, round, and reactive to light.  Scleral injection  Neck: Neck supple.  Cardiovascular: Normal rate, regular rhythm and  normal heart sounds.   No murmur heard. Pulmonary/Chest: Effort normal and breath sounds normal.  Abdominal: Soft. Bowel sounds are normal. He exhibits no distension. There is no tenderness.  Musculoskeletal: He exhibits no edema.  Neurological: He is alert and oriented to person, place, and time.  Fluent speech  Skin: Skin is warm and dry.  Psychiatric: He has a normal mood and affect.  Judgment normal.  Mildly tremulous but calm, good insight  Nursing note and vitals reviewed.   ED Course  Procedures (including critical care time)   MDM   Final diagnoses:  Methadone withdrawal (HCC)   Pt with longstanding hx of methadone use who follows with a psychiatrist who presents with withdrawal symptoms all trying to self wean from medication. On exam, patient mildly tremulous. He was tachycardic at triage but heart rate near 100 during my examination. He demonstrated good insight to his condition during conversation and has involved his wife in assisting him with tapering. I suspect that his symptoms are due to too rapid of the taper. I have recommended going back up to 40-50 mg daily and contacting his psychiatrist for further instruction on how to taper. I have encouraged patient that taper will need to be slower to prevent withdrawal symptoms. The patient and his wife voiced understanding of plan and patient discharged in satisfactory condition.   Laurence Spatesachel Morgan Brandis Matsuura, MD 02/28/15 [phone]

## 2015-02-28 NOTE — ED Notes (Signed)
Patient states he has been taking methadone for the past 15 years - up to 6 per day.  Patient states he began taking for chronic pain due to multiple surgeries to right ankle. Patient states he has been attempting to wean himself off of methadone at home and is down to 2 pills per day.  Patient states he has hade insomnia, diaphoresis, hypertension, and anxiety for the past few days and feels like he is having withdrawal symptoms.  Patient requesting help to finish weaning himself off of methadone.

## 2015-02-28 NOTE — Discharge Instructions (Signed)
Opioid Withdrawal  Opioids are a group of narcotic drugs. They include the street drug heroin. They also include pain medicines, such as morphine, hydrocodone, oxycodone, and fentanyl. Opioid withdrawal is a group of characteristic physical and mental signs and symptoms. It typically occurs if you have been using opioids daily for several weeks or longer and stop using or rapidly decrease use. Opioid withdrawal can also occur if you have used opioids daily for a long time and are given a medicine to block the effect.   SIGNS AND SYMPTOMS  Opioid withdrawal includes three or more of the following symptoms:   · Depressed, anxious, or irritable mood.  · Nausea or vomiting.  · Muscle aches or spasms.    · Watery eyes.     · Runny nose.  · Dilated pupils, sweating, or hairs standing on end.  · Diarrhea or intestinal cramping.  · Yawning.    · Fever.  · Increased blood pressure.  · Fast pulse.  · Restlessness or trouble sleeping.  These signs and symptoms occur within several hours of stopping or reducing short-acting opioids, such as heroin. They can occur within 3 days of stopping or reducing long-acting opioids, such as methadone. Withdrawal begins within minutes of receiving a drug that blocks the effects of opioids, such as naltrexone or naloxone.  DIAGNOSIS   Opioid use disorder is diagnosed by your health care provider. You will be asked about your symptoms, drug and alcohol use, medical history, and use of medicines. A physical exam may be done. Lab tests may be ordered. Your health care provider may have you see a mental health professional.   TREATMENT   The treatment for opioid withdrawal is usually provided by medical doctors with special training in substance use disorders (addiction specialists). The following medicines may be included in treatment:  · Opioids given in place of the abused opioid. They turn on opioid receptors in the brain and lessen or prevent withdrawal symptoms. They are gradually  decreased (opioid substitution and taper).  · Non-opioids that can lessen certain opioid withdrawal symptoms. They may be used alone or with opioid substitution and taper.  Successful long-term recovery usually requires medicine, counseling, and group support.  HOME CARE INSTRUCTIONS   · Take medicines only as directed by your health care provider.  · Check with your health care provider before starting new medicines.  · Keep all follow-up visits as directed by your health care provider.  SEEK MEDICAL CARE IF:  · You are not able to take your medicines as directed.  · Your symptoms get worse.  · You relapse.  SEEK IMMEDIATE MEDICAL CARE IF:  · You have serious thoughts about hurting yourself or others.  · You have a seizure.  · You lose consciousness.     This information is not intended to replace advice given to you by your health care provider. Make sure you discuss any questions you have with your health care provider.     Document Released: 03/28/2003 Document Revised: 04/15/2014 Document Reviewed: 04/07/2013  Elsevier Interactive Patient Education ©2016 [employer]

## 2015-02-28 NOTE — ED Notes (Signed)
Pt has been attempting to get off methadone, having withdrawal symptoms.  Here for help, conversation with EDP and pt and wife would like to do this with colaboration between pt and prescribing MD

## 2017-02-06 ENCOUNTER — Encounter (INDEPENDENT_AMBULATORY_CARE_PROVIDER_SITE_OTHER): Payer: Self-pay | Admitting: Orthopedic Surgery

## 2017-02-06 ENCOUNTER — Ambulatory Visit (INDEPENDENT_AMBULATORY_CARE_PROVIDER_SITE_OTHER): Payer: Federal, State, Local not specified - PPO

## 2017-02-06 ENCOUNTER — Ambulatory Visit (INDEPENDENT_AMBULATORY_CARE_PROVIDER_SITE_OTHER): Payer: Federal, State, Local not specified - PPO | Admitting: Orthopedic Surgery

## 2017-02-06 VITALS — Ht 71.0 in | Wt 215.0 lb

## 2017-02-06 DIAGNOSIS — M25571 Pain in right ankle and joints of right foot: Secondary | ICD-10-CM

## 2017-02-06 NOTE — Progress Notes (Signed)
Office Visit Note   Patient: Elijah Alvarez           Date of Birth: 1966-02-03           MRN: [id number] Visit Date: 02/06/2017              Requested by: Assunta FoundGolding, John, MD [address] [place], [place] [address] PCP: Assunta FoundGolding, John, MD  Chief Complaint  Patient presents with  . Right Ankle - Pain    privious talonavicular fusion and the removal of hardware 07/03/10      HPI: Patient is a 51 year old gentleman who is status post ankle subtalar talonavicular and calcaneocuboid fusion.  She is having pain around the medial side of the ankle as well as inferiorly along the calcaneus.  He states he has pain with ambulation.  Assessment & Plan: Visit Diagnoses:  1. Pain in right ankle and joints of right foot     Plan: Patient was given 3/16 5/16 and 7/16 inch heel lifts he will start with a 3/16 inch heel lift and increase the lift as needed until he is comfortable.  He does have a high cavus arch and most likely will need to provide support to the arch to better facilitate a plantar grade foot.  Follow-Up Instructions: Return in about 4 weeks (around 03/06/2017).   Ortho Exam  Patient is alert, oriented, no adenopathy, well-dressed, normal affect, normal respiratory effort. On examination patient has good pulses there is no redness no cellulitis no swelling no signs of infection no signs of any acute inflammatory process.  Patient is generally tender to palpation over the lateral column of the right hindfoot.  He does have a slight equinus alignment of approximately 3/16 of an inch.  He has a very high cavus arch.  He has minimal motion through the midfoot and forefoot has no pain to palpation through the midfoot or forefoot.  Imaging: Xr Ankle Complete Right  Result Date: 02/06/2017 Three-view radiographs of the right ankle shows a stable ankle fusion as well as stable fusion of the subtalar calcaneal cuboid and talonavicular joints.  No hardware complications.  No images  are attached to the encounter.  Labs: Lab Results  Component Value Date   ESRSEDRATE 4 02/11/2012    Orders:  Orders Placed This Encounter  Procedures  . XR Ankle Complete Right   No orders of the defined types were placed in this encounter.    Procedures: No procedures performed  Clinical Data: No additional findings.  ROS:  All other systems negative, except as noted in the HPI. Review of Systems  Objective: Vital Signs: Ht 5\' 11"  (1.803 m)   Wt 215 lb (97.5 kg)   BMI 29.99 kg/m   Specialty Comments:  No specialty comments available.  PMFS History: Patient Active Problem List   Diagnosis Date Noted  . Rheumatoid arthritis(714.0) 02/11/2012  . Carpal tunnel syndrome of right wrist 02/11/2012   Past Medical History:  Diagnosis Date  . Depression   . Hypercholesteremia   . Interstitial cystitis   . Thyroid disease     No family history on file.  Past Surgical History:  Procedure Laterality Date  . ANKLE SURGERY     multiple right ankle surgeries  . HERNIA REPAIR    . VASECTOMY     Social History   Occupational History  . Not on file.   Social History Main Topics  . Smoking status: Never Smoker  . Smokeless tobacco: Never Used  . Alcohol  use No  . Drug use: Yes    Frequency: 7.0 times per week    Types: Marijuana  . Sexual activity: Not on file

## 2017-03-06 ENCOUNTER — Ambulatory Visit (INDEPENDENT_AMBULATORY_CARE_PROVIDER_SITE_OTHER): Payer: Federal, State, Local not specified - PPO | Admitting: Orthopedic Surgery

## 2017-03-06 ENCOUNTER — Encounter (INDEPENDENT_AMBULATORY_CARE_PROVIDER_SITE_OTHER): Payer: Self-pay

## 2017-04-09 ENCOUNTER — Ambulatory Visit: Payer: Federal, State, Local not specified - PPO | Admitting: Urology

## 2017-04-09 DIAGNOSIS — N411 Chronic prostatitis: Secondary | ICD-10-CM

## 2017-04-09 DIAGNOSIS — E291 Testicular hypofunction: Secondary | ICD-10-CM | POA: Diagnosis not present

## 2018-01-21 ENCOUNTER — Ambulatory Visit: Payer: Federal, State, Local not specified - PPO | Admitting: Urology

## 2018-05-08 ENCOUNTER — Ambulatory Visit (HOSPITAL_COMMUNITY)
Admission: RE | Admit: 2018-05-08 | Discharge: 2018-05-08 | Disposition: A | Discharge status: Home / Self Care | Payer: Federal, State, Local not specified - PPO | Source: Ambulatory Visit | Attending: Family Medicine | Admitting: Family Medicine

## 2018-05-08 ENCOUNTER — Other Ambulatory Visit (HOSPITAL_COMMUNITY): Payer: Self-pay | Admitting: Family Medicine

## 2018-05-08 DIAGNOSIS — J209 Acute bronchitis, unspecified: Secondary | ICD-10-CM | POA: Diagnosis not present

## 2018-10-26 ENCOUNTER — Ambulatory Visit: Payer: Federal, State, Local not specified - PPO | Admitting: Podiatry

## 2019-04-01 ENCOUNTER — Encounter (HOSPITAL_COMMUNITY): Payer: Self-pay | Admitting: *Deleted

## 2019-04-01 ENCOUNTER — Emergency Department (HOSPITAL_COMMUNITY): Payer: Federal, State, Local not specified - PPO

## 2019-04-01 ENCOUNTER — Emergency Department (HOSPITAL_COMMUNITY)
Admission: EM | Admit: 2019-04-01 | Discharge: 2019-04-01 | Disposition: A | Discharge status: Home / Self Care | Payer: Federal, State, Local not specified - PPO | Attending: Emergency Medicine | Admitting: Emergency Medicine

## 2019-04-01 ENCOUNTER — Other Ambulatory Visit: Payer: Self-pay

## 2019-04-01 DIAGNOSIS — I10 Essential (primary) hypertension: Secondary | ICD-10-CM | POA: Insufficient documentation

## 2019-04-01 DIAGNOSIS — S43402A Unspecified sprain of left shoulder joint, initial encounter: Secondary | ICD-10-CM | POA: Insufficient documentation

## 2019-04-01 DIAGNOSIS — Y929 Unspecified place or not applicable: Secondary | ICD-10-CM | POA: Insufficient documentation

## 2019-04-01 DIAGNOSIS — S46912A Strain of unspecified muscle, fascia and tendon at shoulder and upper arm level, left arm, initial encounter: Secondary | ICD-10-CM

## 2019-04-01 DIAGNOSIS — Y93H2 Activity, gardening and landscaping: Secondary | ICD-10-CM | POA: Insufficient documentation

## 2019-04-01 DIAGNOSIS — Z79899 Other long term (current) drug therapy: Secondary | ICD-10-CM | POA: Diagnosis not present

## 2019-04-01 DIAGNOSIS — Y999 Unspecified external cause status: Secondary | ICD-10-CM | POA: Diagnosis not present

## 2019-04-01 DIAGNOSIS — X500XXA Overexertion from strenuous movement or load, initial encounter: Secondary | ICD-10-CM | POA: Diagnosis not present

## 2019-04-01 DIAGNOSIS — S4992XA Unspecified injury of left shoulder and upper arm, initial encounter: Secondary | ICD-10-CM | POA: Diagnosis present

## 2019-04-01 HISTORY — DX: Essential (primary) hypertension: I10

## 2019-04-01 MED ORDER — CYCLOBENZAPRINE HCL 10 MG PO TABS: 10.0000 mg | ORAL_TABLET | Freq: Two times a day (BID) | ORAL | 0 refills | Status: AC | PRN

## 2019-04-01 MED ORDER — DICLOFENAC SODIUM 1 % EX GEL: 2.0000 g | Freq: Four times a day (QID) | CUTANEOUS | 0 refills | Status: AC

## 2019-04-01 MED ORDER — KETOROLAC TROMETHAMINE 60 MG/2ML IM SOLN
30.0000 mg | Freq: Once | INTRAMUSCULAR | Status: AC
  Administered 2019-04-01: 30 mg via INTRAMUSCULAR
  Filled 2019-04-01: qty 2

## 2019-04-01 NOTE — Discharge Instructions (Signed)
Apply voltaren gel to affected area as needed for pain, take flexeril as needed for muscle spasm.  Wear sling for support but please avoid wearing sling for any period longer than 3-4 hrs as it can lead to frozen shoulder.  Return if you have any concerns.

## 2019-04-01 NOTE — ED Triage Notes (Signed)
Pain in left shoulder after picking up a log 3 days ago.

## 2019-04-01 NOTE — ED Notes (Signed)
Patient transported to X-ray 

## 2019-04-01 NOTE — ED Provider Notes (Signed)
[facility] EMERGENCY DEPARTMENT Provider Note   CSN: [id number] Arrival date & time: 04/01/19  1135     History Chief Complaint  Patient presents with  . Shoulder Injury    Elijah Alvarez is a 53 y.o. male.  The history is provided by the patient. No language interpreter was used.  Shoulder Injury       53 year old male with history of RA, gout, tunnel syndrome, presenting for evaluation of left shoulder pain.  Patient reports 3 days ago while picking up a lawn patient felt pain to his left shoulder.  He described pain as a sharp sensation, that is becoming progressively worse.  Pain is nonradiating, improved with rest, but worse with positional change.  Pain rates as 7 out of 10 not adequately managed with over-the-counter medication such as heat, ice, and ibuprofen.  No associated fever chills no neck pain chest pain trouble breathing lightheadedness or dizziness numbness or weakness.  He has right hand dominant.  No prior injury to the same shoulder.  Past Medical History:  Diagnosis Date  . Depression   . Hypercholesteremia   . Hypertension   . Interstitial cystitis   . Thyroid disease     Patient Active Problem List   Diagnosis Date Noted  . Rheumatoid arthritis(714.0) 02/11/2012  . Carpal tunnel syndrome of right wrist 02/11/2012    Past Surgical History:  Procedure Laterality Date  . ANKLE SURGERY     multiple right ankle surgeries  . HERNIA REPAIR    . VASECTOMY         History reviewed. No pertinent family history.  Social History   Tobacco Use  . Smoking status: Never Smoker  . Smokeless tobacco: Never Used  Substance Use Topics  . Alcohol use: No  . Drug use: Yes    Frequency: 7.0 times per week    Types: Marijuana    Home Medications Prior to Admission medications   Medication Sig Start Date End Date Taking? Authorizing Provider  atorvastatin (LIPITOR) 10 MG tablet Take 1 tablet by mouth every morning.  02/27/15   [provider]  Ciclopirox 1 % shampoo Apply 1 application topically See admin instructions. Every third day 12/01/14   [provider]  diphenhydrAMINE (BENADRYL) 25 mg capsule Take 150-200 mg by mouth at bedtime as needed for sleep.    [provider]  DULoxetine (CYMBALTA) 60 MG capsule Take 1 capsule by mouth daily. 02/27/15   [provider]  ELMIRON 100 MG capsule Take 1 capsule by mouth daily. 02/27/15   [provider]  levothyroxine (SYNTHROID, LEVOTHROID) 100 MCG tablet Take 1 tablet by mouth daily. 02/27/15   [provider]  levothyroxine (SYNTHROID, LEVOTHROID) 25 MCG tablet Take 1 tablet by mouth daily. 02/27/15   [provider]  methadone (DOLOPHINE) 10 MG tablet Take 30 mg by mouth 2 (two) times daily.    [provider]    Allergies    Patient has no known allergies.  Review of Systems   Review of Systems  Constitutional: Negative for fever.  Musculoskeletal: Positive for arthralgias.  Neurological: Negative for numbness.    Physical Exam Updated Vital Signs BP (!) 142/103 (BP Location: Right Arm)   Pulse 85   Temp (!) 97.2 F (36.2 C) (Oral)   Resp 15   SpO2 95%   Physical Exam Vitals and nursing note reviewed.  Constitutional:      General: He is not in acute distress.    Appearance:  He is well-developed.  HENT:     Head: Atraumatic.  Eyes:     Conjunctiva/sclera: Conjunctivae normal.  Cardiovascular:     Rate and Rhythm: Regular rhythm.     Pulses: Normal pulses.     Heart sounds: Normal heart sounds.  Pulmonary:     Effort: Pulmonary effort is normal.     Breath sounds: Normal breath sounds.  Musculoskeletal:        General: Tenderness (Left shoulder: Tenderness to lateral deltoid as well as GH joint on palpation with decreased shoulder abduction abduction flexion and shoulder raise secondary to pain.  No obvious deformity, no overlying skin changes.) present.     Cervical back: Normal range of  motion and neck supple. No rigidity or tenderness.     Comments: Left elbow and wrist nontender with full range of motion, radial pulse 2+, normal grip strength.  No tenderness to midline cervical spine.  Skin:    Findings: No rash.  Neurological:     Mental Status: He is alert.     ED Results / Procedures / Treatments   Labs (all labs ordered are listed, but only abnormal results are displayed) Labs Reviewed - No data to display  EKG None  Radiology DG Shoulder Left  Result Date: 04/01/2019 CLINICAL DATA:  Lifting injury 3-4 days ago with pop and pain left shoulder. EXAM: LEFT SHOULDER - 2+ VIEW COMPARISON:  None. FINDINGS: Very minimal degenerate change of the glenohumeral joint. No evidence of acute fracture or dislocation. IMPRESSION: No acute findings. Electronically Signed   By: Elberta Fortis M.D.   On: 04/01/2019 12:35    Procedures Procedures (including critical care time)  Medications Ordered in ED Medications  ketorolac (TORADOL) injection 30 mg (30 mg Intramuscular Given 04/01/19 1216)    ED Course  I have reviewed the triage vital signs and the nursing notes.  Pertinent labs & imaging results that were available during my care of the patient were reviewed by me and considered in my medical decision making (see chart for details).    MDM Rules/Calculators/A&P                      BP (!) 142/103 (BP Location: Right Arm)   Pulse 85   Temp (!) 97.2 F (36.2 C) (Oral)   Resp 15   SpO2 95%   Final Clinical Impression(s) / ED Diagnoses Final diagnoses:  Left shoulder strain, initial encounter    Rx / DC Orders ED Discharge Orders    None     12:08 PM Patient here with left shoulder pain after lifting a log 3 days ago.  He has decreased passive and active range of motion about his left shoulder with tenderness along the lateral deltoid and at the glenohumeral joint.  No evidence of shoulder dislocation on initial exam and no signs of infection.  X-ray  ordered to screen for potential bony pathology. Toradol for pain  12:45 PM X-ray of the left shoulder without any acute findings.  Minimal degenerative changes of the glenohumeral joint.  I suspect patient's pain is musculoskeletal in origin, likely muscle strain.  Will provide symptomatic treatment including rice therapy.  Sling provided for support.  Patient encouraged to avoid wearing a sling for any long period time to decrease risk of adhesive capsulitis.  Orthopedic referral given as needed.  Return caution discussed.   Fayrene Helper, PA-C 04/01/19 1250    Eber Hong, MD 04/01/19 [phone]

## 2019-04-28 ENCOUNTER — Ambulatory Visit: Payer: Federal, State, Local not specified - PPO | Attending: Internal Medicine

## 2019-04-28 ENCOUNTER — Other Ambulatory Visit: Payer: Self-pay

## 2019-04-28 DIAGNOSIS — Z20822 Contact with and (suspected) exposure to covid-19: Secondary | ICD-10-CM

## 2019-04-29 ENCOUNTER — Ambulatory Visit: Payer: Self-pay

## 2019-04-29 LAB — NOVEL CORONAVIRUS, NAA: SARS-CoV-2, NAA: NOT DETECTED

## 2019-04-29 NOTE — Telephone Encounter (Signed)
Patient calling for covid test results.  Results are not back yet .  Patient will call back.

## 2021-11-08 ENCOUNTER — Ambulatory Visit: Payer: Federal, State, Local not specified - PPO | Admitting: Podiatry

## 2021-11-08 ENCOUNTER — Ambulatory Visit (INDEPENDENT_AMBULATORY_CARE_PROVIDER_SITE_OTHER): Payer: Federal, State, Local not specified - PPO

## 2021-11-08 DIAGNOSIS — M7751 Other enthesopathy of right foot: Secondary | ICD-10-CM

## 2021-11-08 DIAGNOSIS — Q663 Other congenital varus deformities of feet, unspecified foot: Secondary | ICD-10-CM | POA: Diagnosis not present

## 2021-11-08 DIAGNOSIS — Z981 Arthrodesis status: Secondary | ICD-10-CM | POA: Diagnosis not present

## 2021-11-08 DIAGNOSIS — M79671 Pain in right foot: Secondary | ICD-10-CM | POA: Diagnosis not present

## 2021-11-08 DIAGNOSIS — M779 Enthesopathy, unspecified: Secondary | ICD-10-CM | POA: Diagnosis not present

## 2021-11-08 NOTE — Progress Notes (Signed)
Subjective:   Patient ID: Elijah Alvarez, male   DOB: 56 y.o.   MRN: [id number]   HPI Chief Complaint  Patient presents with   Foot Pain    Pt came in today for right ankle and foot pain, pt had surg 15+ years ago and had ankle fused(11 surg all together), pt started having more pain 2 years, Pain - aches, Rate pain is a 3 out of 10, Tx: pain meds, inserts,    56 year old male presents with above complaints.  Broke when 25 years ago, broke talus. He had a surgery and the screw removed and was good for 3 years. Then the cartilage was gong away and had a scope. Went to Dr. Lajoyce Corners and had multiple surgery. If he is on his foot a lot or not much at times, sometimes when he wakes up it will hurt. Most pain is sub 5 he points to then alon the ankle and along the achilles area. Also points to medial ankle.   Review of Systems  All other systems reviewed and are negative.  Past Medical History:  Diagnosis Date   Depression    Hypercholesteremia    Hypertension    Interstitial cystitis    Thyroid disease     Past Surgical History:  Procedure Laterality Date   ANKLE SURGERY     multiple right ankle surgeries   HERNIA REPAIR     VASECTOMY       Current Outpatient Medications:    atorvastatin (LIPITOR) 10 MG tablet, Take 1 tablet by mouth every morning. , Disp: , Rfl:    Ciclopirox 1 % shampoo, Apply 1 application topically See admin instructions. Every third day, Disp: , Rfl:    cyclobenzaprine (FLEXERIL) 10 MG tablet, Take 1 tablet (10 mg total) by mouth 2 (two) times daily as needed for muscle spasms., Disp: 20 tablet, Rfl: 0   diclofenac Sodium (VOLTAREN) 1 % GEL, Apply 2 g topically 4 (four) times daily., Disp: 150 g, Rfl: 0   diphenhydrAMINE (BENADRYL) 25 mg capsule, Take 150-200 mg by mouth at bedtime as needed for sleep., Disp: , Rfl:    DULoxetine (CYMBALTA) 60 MG capsule, Take 1 capsule by mouth daily., Disp: , Rfl:    ELMIRON 100 MG capsule, Take 1 capsule by mouth daily., Disp: ,  Rfl:    levothyroxine (SYNTHROID, LEVOTHROID) 100 MCG tablet, Take 1 tablet by mouth daily., Disp: , Rfl:    levothyroxine (SYNTHROID, LEVOTHROID) 25 MCG tablet, Take 1 tablet by mouth daily., Disp: , Rfl:    methadone (DOLOPHINE) 10 MG tablet, Take 30 mg by mouth 2 (two) times daily., Disp: , Rfl:   No Known Allergies     Objective:  Physical Exam  General: AAO x3, NAD  Dermatological: Skin is warm, dry and supple bilateral.  Incisions are prior surgeries are all well-healed.  There are no open sores, no preulcerative lesions, no rash or signs of infection present.  Vascular: Dorsalis Pedis artery and Posterior Tibial artery pedal pulses are 2/4 bilateral with immedate capillary fill time.  There is no pain with calf compression, swelling, warmth, erythema.   Neruologic: Grossly intact via light touch bilateral.   Musculoskeletal: Upon weightbearing varus deformity is noted.  This majority tenderness is localized along the fifth MPJ.  Does get some pain along the Achilles as well as the medial ankle but the majority discomfort is along the fifth MPJ.  There is no edema, erythema.  No open lesions.  Muscular strength 5/5  in all groups tested bilateral.  Gait: Unassisted, Nonantalgic.       Assessment:   56 year old male with capsulitis, metatarsalgia     Plan:  -Treatment options discussed including all alternatives, risks, and complications -Etiology of symptoms were discussed -X-rays were obtained and reviewed with the patient.  3 views of the right foot and 2 views of the right ankle were obtained.  There is no evidence of a fracture.  Previous triple arthrodesis as well as ankle joint arthrodesis with hardware intact.  There is no evidence of acute fracture at this time. -Also discussed with him different types of inserts to help offload the fifth MPJ.  He was measured for orthotics today we will make this to offload the fifth MPJ as well as get cushion. -Discussed steroid  injection if needed but has had this previously without improvement.  Discussed anti-inflammatories if needed.  Vivi Barrack DPM

## 2021-11-28 ENCOUNTER — Other Ambulatory Visit: Payer: Self-pay | Admitting: Podiatry

## 2021-11-28 DIAGNOSIS — M7751 Other enthesopathy of right foot: Secondary | ICD-10-CM

## 2021-12-20 ENCOUNTER — Other Ambulatory Visit: Payer: Medicare Other

## 2022-03-14 ENCOUNTER — Telehealth: Payer: Self-pay | Admitting: Podiatry

## 2022-03-14 NOTE — Telephone Encounter (Signed)
2nd call to patient to pick up orthotics - lm on vm   Balance 97.34

## 2022-04-11 ENCOUNTER — Encounter: Payer: Self-pay | Admitting: Podiatry

## 2022-04-11 NOTE — Telephone Encounter (Signed)
3rd call to patient to pick up orthotics - lm on vm    Balance 97.3  Mailing letter to patient

## 2022-10-04 ENCOUNTER — Other Ambulatory Visit: Payer: Medicare Other
